# Patient Record
Sex: Male | Born: 1985 | Race: Black or African American | Hispanic: No | Marital: Single | Smoking: Never smoker
Health system: Southern US, Community
[De-identification: ages and names within clinical notes are randomized; demographics above are authoritative.]

---

## 2019-04-02 ENCOUNTER — Emergency Department (HOSPITAL_COMMUNITY)
Admission: EM | Admit: 2019-04-02 | Discharge: 2019-04-02 | Disposition: A | Discharge status: Home / Self Care | Payer: Self-pay | Attending: Emergency Medicine | Admitting: Emergency Medicine

## 2019-04-02 ENCOUNTER — Emergency Department (HOSPITAL_COMMUNITY): Payer: Self-pay

## 2019-04-02 ENCOUNTER — Encounter (HOSPITAL_COMMUNITY): Payer: Self-pay | Admitting: Emergency Medicine

## 2019-04-02 ENCOUNTER — Other Ambulatory Visit: Payer: Self-pay

## 2019-04-02 DIAGNOSIS — M79632 Pain in left forearm: Secondary | ICD-10-CM

## 2019-04-02 DIAGNOSIS — Y998 Other external cause status: Secondary | ICD-10-CM | POA: Insufficient documentation

## 2019-04-02 DIAGNOSIS — Y9289 Other specified places as the place of occurrence of the external cause: Secondary | ICD-10-CM | POA: Insufficient documentation

## 2019-04-02 DIAGNOSIS — Y9389 Activity, other specified: Secondary | ICD-10-CM | POA: Insufficient documentation

## 2019-04-02 DIAGNOSIS — S63502A Unspecified sprain of left wrist, initial encounter: Secondary | ICD-10-CM | POA: Insufficient documentation

## 2019-04-02 DIAGNOSIS — X509XXA Other and unspecified overexertion or strenuous movements or postures, initial encounter: Secondary | ICD-10-CM | POA: Insufficient documentation

## 2019-04-02 NOTE — ED Provider Notes (Signed)
[facility]-EMERGENCY DEPT Provider Note   CSN: [id number] Arrival date & time: 04/02/19  1727     History Chief Complaint  Patient presents with  . Hand Pain  . Wrist Pain  . Arm Pain    Carlos Reyes is a 33 y.o. male presents today with left forearm and left wrist pain.  He reports that he was arrested on February 13, 2019 and at that time he believes that the handcuffs were placed too tightly on his wrist.  He reports that since that time he has had left wrist pain primarily around the mid radius and distal radius constant occasionally radiating up and down his forearm mild intensity worsened with movement and palpation and improved with rest.  He reports that his pain initially resolved by the end of November.  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  He denies any new injury.  Denies fever/chills, headache, neck pain, back pain, chest pain/shortness of breath, abdominal pain, nausea/vomiting, numbness/tingling, weakness, swelling/color change or any additional concerns.  HPI     History reviewed. No pertinent past medical history.  There are no problems to display for this patient.   History reviewed. No pertinent surgical history.     No family history on file.  Social History   Tobacco Use  . Smoking status: Never Smoker  . Smokeless tobacco: Never Used  Substance Use Topics  . Alcohol use: Never  . Drug use: Not on file    Home Medications Prior to Admission medications   Not on File    Allergies    Patient has no allergy information on record.  Review of Systems   Review of Systems Ten systems are reviewed and are negative for acute change except as noted in the HPI  Physical Exam Updated Vital Signs BP (!) 150/95 (BP Location: Right Arm)   Pulse 87   Temp 100 F (37.8  C) (Oral)   Resp 16   SpO2 99%   Physical Exam Constitutional:      General: He is not in acute distress.    Appearance: Normal appearance. He is well-developed. He is not ill-appearing or diaphoretic.  HENT:     Head: Normocephalic and atraumatic.     Right Ear: External ear normal.     Left Ear: External ear normal.     Nose: Nose normal.  Eyes:     General: Vision grossly intact. Gaze aligned appropriately.     Pupils: Pupils are equal, round, and reactive to light.  Neck:     Trachea: Trachea and phonation normal. No tracheal deviation.  Pulmonary:     Effort: Pulmonary effort is normal. No respiratory distress.  Abdominal:     General: There is no distension.     Palpations: Abdomen is soft.     Tenderness: There is no abdominal tenderness. There is no guarding or rebound.  Musculoskeletal:        General: Normal range of motion.     Cervical back: Normal range of motion.     Comments: No midline C/T/L spinal tenderness to palpation, no paraspinal muscle tenderness, no deformity, crepitus, or step-off noted. No sign of injury to the neck or back. - Normal-appearing bilateral upper extremities.  Left hand: No gross deformities, skin intact. Fingers appear normal.  Tenderness to  palpation over distal radius. No snuffbox tenderness to palpation. No tenderness to palpation over flexor sheath. Finger adduction/abduction intact with 5/5 strength.  Thumb opposition intact. Full active and resisted ROM to flexion/extension at wrist, MCP, PIP and DIP of all fingers.  FDS/FDP intact. Grip 5/5 strength.  Radial artery 2+ with <2sec cap refill in all fingers.  Sensation intact to light-tough in median/ulnar/radial distributions.  Compartments soft. - Range of motion and strength at left elbow is intact without pain, no bony tenderness, swelling, deformity or overlying skin changes.  Compartments soft. - Range of motion and strength intact at the shoulder without pain.   Skin:     General: Skin is warm and dry.  Neurological:     Mental Status: He is alert.     GCS: GCS eye subscore is 4. GCS verbal subscore is 5. GCS motor subscore is 6.     Comments: Speech is clear and goal oriented, follows commands Major Cranial nerves without deficit, no facial droop Moves extremities without ataxia, coordination intact  Psychiatric:        Behavior: Behavior normal.     ED Results / Procedures / Treatments   Labs (all labs ordered are listed, but only abnormal results are displayed) Labs Reviewed - No data to display  EKG None  Radiology DG Elbow Complete Left  Result Date: 04/02/2019 CLINICAL DATA:  32 year old who complains of persistent LEFT UPPER EXTREMITY pain after being arrested 1-1/2 months ago. EXAM: LEFT ELBOW - COMPLETE 3+ VIEW COMPARISON:  None. FINDINGS: No evidence of acute fracture or dislocation. Well-preserved joint spaces. No intrinsic osseous abnormalities. No posterior fat pad to confirm joint effusion or hemarthrosis. IMPRESSION: Normal examination. Electronically Signed   By: Hulan Saas M.D.   On: 04/02/2019 18:55   DG Forearm Left  Result Date: 04/02/2019 CLINICAL DATA:  33 year old who complains of persistent LEFT UPPER EXTREMITY pain after being arrested 1-1/2 months ago. EXAM: LEFT FOREARM - 2 VIEW COMPARISON:  None. FINDINGS: No evidence of acute fracture involving the radius or ulna. No intrinsic osseous abnormality. IMPRESSION: Normal examination. Electronically Signed   By: Hulan Saas M.D.   On: 04/02/2019 18:57   DG Wrist Complete Left  Result Date: 04/02/2019 CLINICAL DATA:  33 year old who complains of persistent LEFT UPPER EXTREMITY pain after being arrested 1-1/2 months ago. EXAM: LEFT WRIST - COMPLETE 3+ VIEW COMPARISON:  None. FINDINGS: No evidence of acute fracture or dislocation. Joint spaces well preserved. Well-preserved bone mineral density. No intrinsic osseous abnormalities. IMPRESSION: Normal examination.  Electronically Signed   By: Hulan Saas M.D.   On: 04/02/2019 18:57    Procedures Procedures (including critical care time)  Medications Ordered in ED Medications - No data to display  ED Course  I have reviewed the triage vital signs and the nursing notes.  Pertinent labs & imaging results that were available during my care of the patient were reviewed by me and considered in my medical decision making (see chart for details).    MDM Rules/Calculators/A&P                     33 year old male presents today with left forearm and wrist pain after being placed in handcuffs on February 13, 2019.  Pain had initially resolved but has since returned with him starting a new job 2 weeks ago.  He reports primarily distal left radius pain with movement at the wrist, pain occasionally radiates down towards his elbow but denies elbow pain today.  Upper extremity appears normal, no evidence of skin break, cellulitis, DVT, septic arthritis, compartment syndrome, deformity or gross ligamentous laxity.  Capillary refill and sensation intact to all fingers, strong equal radial pulses.  Will obtain x-ray of patient's area of pain including joint above and below.  Patient without elbow pain, shoulder pain or neck/back pain.  No neurologic complaints. - DG Left Elbow:  IMPRESSION:  Normal examination.   DG Left Forearm:  IMPRESSION:  Normal examination.   DG Left Wrist:    IMPRESSION:  Normal examination.   - Reassuring x-ray imaging today without abnormalities.  Suspect patient with likely sprain at the left wrist however due to proximity to scaphoid bone will place patient in thumb spica splint today and refer to orthopedics for follow-up.  RICE therapy discussed and patient states understanding, will give work note as well.  Images were reviewed today by myself and I agree with radiologist interpretation. Nursing notes reviewed. Vital signs reviewed, patient with documented temperature of 100  F, patient denies fever or chills, borderline temperature today, I do not feel this is associated with patient's complaint today, there is no sign of a cellulitis, septic arthritis or other infectious-like symptoms.  Will encourage patient to monitor temperature at home and follow-up with PCP.  No indication for further work-up at this time.  At this time there does not appear to be any evidence of an acute emergency medical condition and the patient appears stable for discharge with appropriate outpatient follow up. Diagnosis was discussed with patient who verbalizes understanding of care plan and is agreeable to discharge. I have discussed return precautions with patient who verbalizes understanding of return precautions. Patient encouraged to follow-up with their PCP and ortho. All questions answered.  Patient has been discharged in good condition.  Note: Portions of this report may have been transcribed using voice recognition software. Every effort was made to ensure accuracy; however, inadvertent computerized transcription errors may still be present. Final Clinical Impression(s) / ED Diagnoses Final diagnoses:  Left forearm pain  Sprain of left wrist, initial encounter    Rx / DC Orders ED Discharge Orders    None       Gari Crown 04/02/19 2038    Dorie Rank, MD 04/06/19 [phone]

## 2019-04-02 NOTE — ED Triage Notes (Signed)
Patient here from home with complaints of left hand and wrist pain after being arrested. States that he now uses his hands for work and is concerned.

## 2019-04-02 NOTE — Discharge Instructions (Signed)
You have been diagnosed today with left forearm pain, wrist sprain.  At this time there does not appear to be the presence of an emergent medical condition, however there is always the potential for conditions to change. Please read and follow the below instructions.  Please return to the Emergency Department immediately for any new or worsening symptoms. Please be sure to follow up with your Primary Care Provider within one week regarding your visit today; please call their office to schedule an appointment even if you are feeling better for a follow-up visit. Your x-rays today were reassuring however as we discussed there is a possibility of ligamentous, tendon, or muscular injury among other soft tissue abnormalities.  Additionally there is a possibility of unseen fracture of bones today.  Additionally there is a small bone in your wrist called the scaphoid, injuries to this bone do not always show up on initial x-ray pictures and it may need further evaluation with x-rays.  I advise that you call the orthopedic specialist Dr. Mardelle Matte on your discharge paperwork to schedule a follow-up appointment for reevaluation.  Please continue to wear the thumb spica splint given to you today to protect your wrist from further injury.  Get help right away if: You have a new or sudden sharp pain in the hand, arm, or wrist. You have tingling or numbness in your hand. Your fingers turn white, very red, or cold and blue. You cannot move your fingers. You have fever or chills You have any new/concerning or worsening of symptoms  Please read the additional information packets attached to your discharge summary.  Do not take your medicine if  develop an itchy rash, swelling in your mouth or lips, or difficulty breathing; call 911 and seek immediate emergency medical attention if this occurs.  Note: Portions of this text may have been transcribed using voice recognition software. Every effort was made to ensure  accuracy; however, inadvertent computerized transcription errors may still be present.

## 2021-05-26 ENCOUNTER — Emergency Department (HOSPITAL_COMMUNITY)
Admission: EM | Admit: 2021-05-26 | Discharge: 2021-05-26 | Disposition: A | Discharge status: Home / Self Care | Payer: Self-pay | Attending: Emergency Medicine | Admitting: Emergency Medicine

## 2021-05-26 ENCOUNTER — Encounter (HOSPITAL_COMMUNITY): Payer: Self-pay

## 2021-05-26 ENCOUNTER — Other Ambulatory Visit: Payer: Self-pay

## 2021-05-26 DIAGNOSIS — R110 Nausea: Secondary | ICD-10-CM | POA: Insufficient documentation

## 2021-05-26 DIAGNOSIS — R0781 Pleurodynia: Secondary | ICD-10-CM | POA: Insufficient documentation

## 2021-05-26 DIAGNOSIS — J069 Acute upper respiratory infection, unspecified: Secondary | ICD-10-CM | POA: Insufficient documentation

## 2021-05-26 DIAGNOSIS — Z20822 Contact with and (suspected) exposure to covid-19: Secondary | ICD-10-CM | POA: Insufficient documentation

## 2021-05-26 LAB — RESP PANEL BY RT-PCR (FLU A&B, COVID) ARPGX2
Influenza A by PCR: NEGATIVE
Influenza B by PCR: NEGATIVE
SARS Coronavirus 2 by RT PCR: NEGATIVE

## 2021-05-26 MED ORDER — FLUTICASONE PROPIONATE 50 MCG/ACT NA SUSP: 2.0000 | Freq: Every day | NASAL | 2 refills | Status: AC

## 2021-05-26 MED ORDER — ONDANSETRON HCL 4 MG PO TABS: 4.0000 mg | ORAL_TABLET | Freq: Four times a day (QID) | ORAL | 0 refills | Status: AC

## 2021-05-26 MED ORDER — IBUPROFEN 800 MG PO TABS
800.0000 mg | ORAL_TABLET | Freq: Once | ORAL | Status: AC
  Administered 2021-05-26: 800 mg via ORAL
  Filled 2021-05-26: qty 1

## 2021-05-26 MED ORDER — ACETAMINOPHEN 325 MG PO TABS
650.0000 mg | ORAL_TABLET | Freq: Once | ORAL | Status: AC
  Administered 2021-05-26: 650 mg via ORAL
  Filled 2021-05-26: qty 2

## 2021-05-26 NOTE — ED Notes (Signed)
I provided reinforced discharge education based off of discharge instructions. Pt acknowledged and understood my education. Pt had no further questions/concerns for provider/myself.  °

## 2021-05-26 NOTE — Discharge Instructions (Addendum)
As we discussed your symptoms are consistent with a viral upper respiratory infection.  You do not have COVID or the flu.  I encourage plenty of fluids, rest. You can use Tylenol for any fever, chills, ibuprofen for any body aches or headache. If you feel like you have sinus pressure congestion you can consider some over-the-counter saline nose spray, or a Nettie pot.  You can use over-the-counter Mucinex, or other cough and cold medication for congestion, sore throat, cough.  If your symptoms worsen, you develop significant shortness of breath or chest pain please return to the emergency department for further evaluation.  

## 2021-05-26 NOTE — ED Triage Notes (Signed)
Patient reports that he has had a non productive cough, sore throat, and a frontal headache x 2 days. Patient states he has been taking OTC meds with no relief. Patient c/o flank pain and states started when coughing began.

## 2021-05-26 NOTE — ED Provider Notes (Signed)
[facility] DEPT Provider Note   CSN: [id number] Arrival date & time: 05/26/21  0906     History  Chief Complaint  Patient presents with   Sore Throat   Dizziness   Cough    Carlos Reyes is a 36 y.o. male no significant past medical history presents with productive cough, sore throat, headache, body aches, some right-sided rib pain with coughing for the last 3 to 4 days.  Patient reports that he has had some chills at home.  Patient reports that he is taking some Alka-Seltzer plus as well as ibuprofen with minimal relief of symptoms.  Patient endorses some nausea but denies vomiting, constipation, diarrhea, abdominal pain.  Patient denies chest pain.   Sore Throat  Dizziness Cough     Home Medications Prior to Admission medications   Medication Sig Start Date End Date Taking? Authorizing Provider  fluticasone (FLONASE) 50 MCG/ACT nasal spray Place 2 sprays into both nostrils daily. 05/26/21  Yes Macallan Ord H, PA-C  ondansetron (ZOFRAN) 4 MG tablet Take 1 tablet (4 mg total) by mouth every 6 (six) hours. 05/26/21  Yes Moosa Bueche H, PA-C      Allergies    Patient has no known allergies.    Review of Systems   Review of Systems  Respiratory:  Positive for cough.   Neurological:  Positive for dizziness.  All other systems reviewed and are negative.  Physical Exam Updated Vital Signs BP (!) 155/105    Pulse (!) 102    Temp 97.8 F (36.6 C) (Oral)    Resp 18    Ht 6' 1.5" (1.867 m)    Wt 104.3 kg    SpO2 95%    BMI 29.93 kg/m  Physical Exam Vitals and nursing note reviewed.  Constitutional:      General: He is not in acute distress.    Appearance: Normal appearance.  HENT:     Head: Normocephalic and atraumatic.  Eyes:     General:        Right eye: No discharge.        Left eye: No discharge.  Cardiovascular:     Rate and Rhythm: Normal rate and regular rhythm.     Heart sounds: No murmur heard.   No friction rub.  No gallop.     Comments: Transient tachycardia on arrival, no tachycardia on my exam Pulmonary:     Effort: Pulmonary effort is normal.     Breath sounds: Normal breath sounds.     Comments: Clear breath sounds throughout Abdominal:     General: Bowel sounds are normal.     Palpations: Abdomen is soft.  Skin:    General: Skin is warm and dry.     Capillary Refill: Capillary refill takes less than 2 seconds.  Neurological:     Mental Status: He is alert and oriented to person, place, and time.  Psychiatric:        Mood and Affect: Mood normal.        Behavior: Behavior normal.    ED Results / Procedures / Treatments   Labs (all labs ordered are listed, but only abnormal results are displayed) Labs Reviewed  RESP PANEL BY RT-PCR (FLU A&B, COVID) [id number]    EKG None  Radiology No results found.  Procedures Procedures    Medications Ordered in ED Medications  acetaminophen (TYLENOL) tablet 650 mg (650 mg Oral Given 05/26/21 0932)  ibuprofen (ADVIL) tablet 800 mg (800 mg Oral Given 05/26/21  [email or web address])    ED Course/ Medical Decision Making/ A&P                           Medical Decision Making Risk OTC drugs. Prescription drug management.  This is an overall well-appearing male with no significant past medical history presents with 3 to 4 days of upper respiratory infectious symptoms including cough, sore throat, headache.  Patient reports he has tried Alka-Seltzer, and ibuprofen over-the-counter with some relief.  Emergent differential diagnosis includes acute bronchitis, reactive airway disease, pneumonia, COVID-19, flu.  This is not an exhaustive differential.  On my exam patient has normal breath sounds throughout, he does have some transient already on arrival that resolves with rest.  He has no tenderness to palpation of the abdomen.  His right-sided flank pain appears to be pleuritic rib pain with cough, denies any pain in the right flank at rest.  His RVP is negative  for COVID, flu.  He remains in no acute distress, afebrile, and maintaining good oxygen saturation.  Believe his symptoms are consistent with a viral upper respiratory infection plus or minus viral sinusitis.  Will discharge with prescription for Flonase, Zofran, encourage ibuprofen, Tylenol, fluids, rest.  Patient discharged in stable condition at this time, return precautions given. Final Clinical Impression(s) / ED Diagnoses Final diagnoses:  Viral upper respiratory tract infection    Rx / DC Orders ED Discharge Orders          Ordered    fluticasone (FLONASE) 50 MCG/ACT nasal spray  Daily        05/26/21 1034    ondansetron (ZOFRAN) 4 MG tablet  Every 6 hours        05/26/21 1034              Landen Breeland, Jackalyn Lombard 05/26/21 1035    Luna Fuse, MD 06/01/21 [phone]

## 2021-06-30 IMAGING — CR DG FOREARM 2V*L*
2 series · 2 of 2 positions shown · non-contrast
Comparison: None.

CLINICAL DATA: 33-year-old who complains of persistent LEFT UPPER
EXTREMITY pain after being arrested 1-1/2 months ago.

EXAM:
LEFT FOREARM - 2 VIEW

[x forearm ap left]
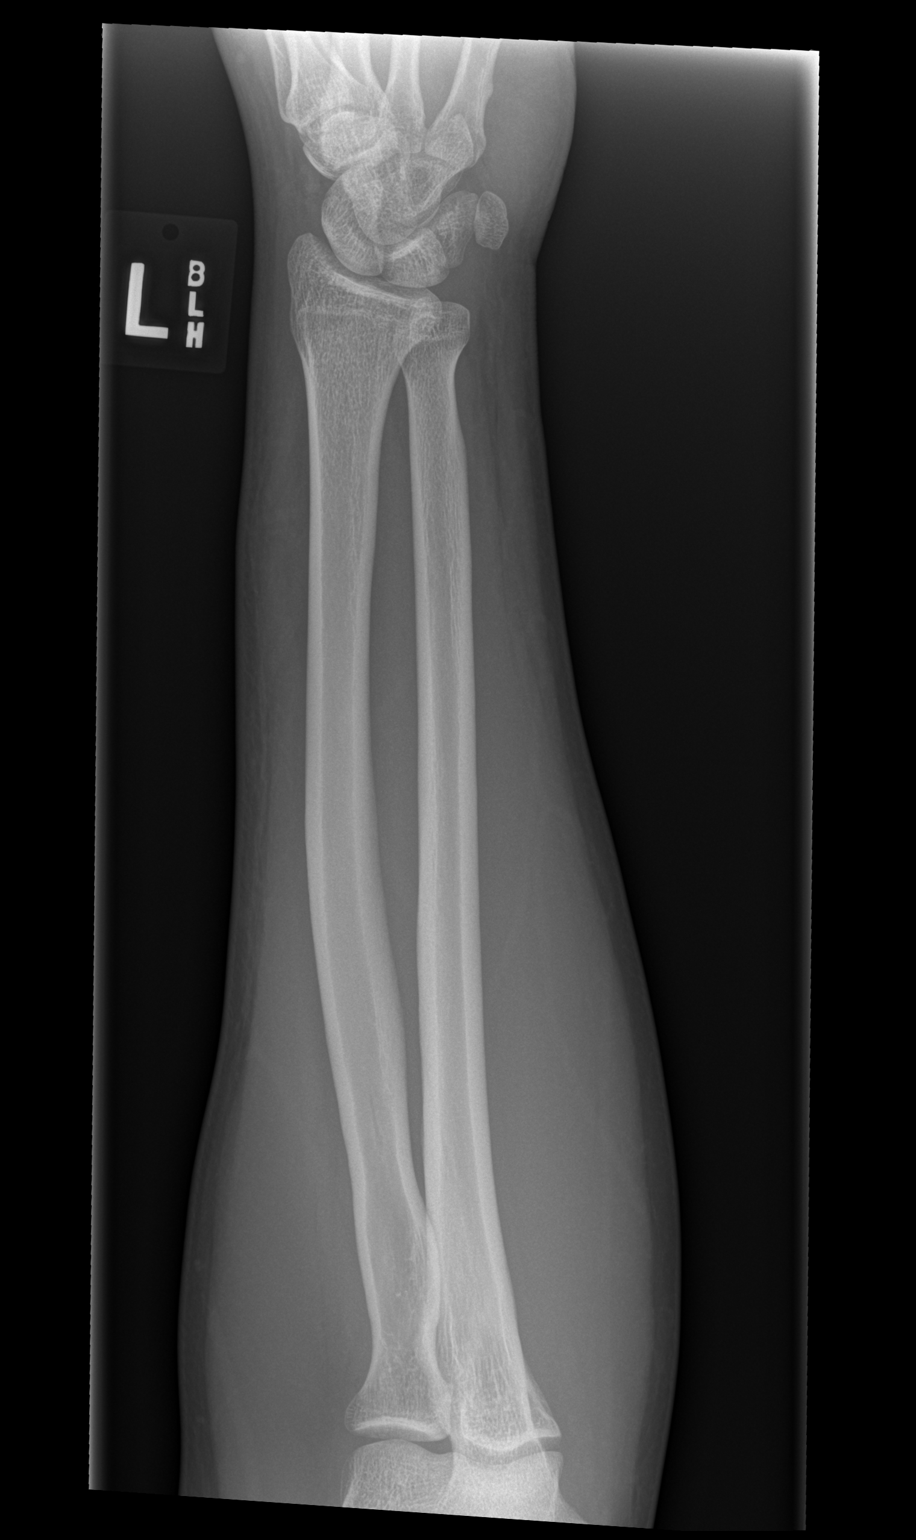

[x forearm lat left]
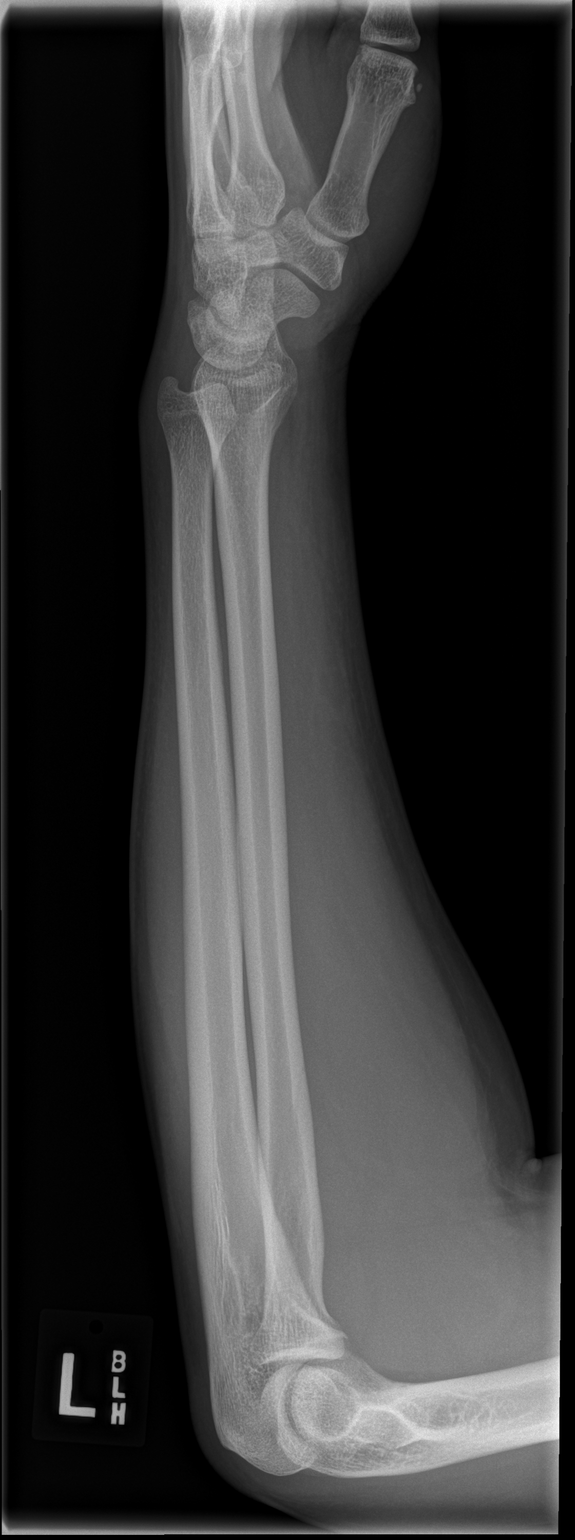

[2 of 2 positions shown; findings below may reference images not displayed]

FINDINGS: No evidence of acute fracture involving the radius or ulna. No
intrinsic osseous abnormality.
IMPRESSION: Normal examination.
